# Patient Record
Sex: Male | Born: 1970 | Race: Black or African American | Hispanic: No | Marital: Single | Smoking: Current every day smoker
Health system: Southern US, Community
[De-identification: ages and names within clinical notes are randomized; demographics above are authoritative.]

## PROBLEM LIST (undated history)

## (undated) DIAGNOSIS — R7303 Prediabetes: Secondary | ICD-10-CM

## (undated) DIAGNOSIS — I10 Essential (primary) hypertension: Secondary | ICD-10-CM

---

## 2011-01-11 ENCOUNTER — Emergency Department: Payer: Self-pay | Admitting: Emergency Medicine

## 2011-05-31 ENCOUNTER — Emergency Department: Payer: Self-pay | Admitting: Unknown Physician Specialty

## 2011-07-08 ENCOUNTER — Emergency Department: Payer: Self-pay | Admitting: *Deleted

## 2012-02-13 ENCOUNTER — Emergency Department: Payer: Self-pay | Admitting: Emergency Medicine

## 2012-02-13 LAB — COMPREHENSIVE METABOLIC PANEL
Albumin: 3.7 g/dL (ref 3.4–5.0)
Alkaline Phosphatase: 80 U/L (ref 50–136)
Anion Gap: 8 (ref 7–16)
BUN: 8 mg/dL (ref 7–18)
Bilirubin,Total: 0.6 mg/dL (ref 0.2–1.0)
Calcium, Total: 9 mg/dL (ref 8.5–10.1)
Chloride: 109 mmol/L — ABNORMAL HIGH (ref 98–107)
Co2: 24 mmol/L (ref 21–32)
Creatinine: 2.04 mg/dL — ABNORMAL HIGH (ref 0.60–1.30)
EGFR (African American): 46 — ABNORMAL LOW
EGFR (Non-African Amer.): 40 — ABNORMAL LOW
Glucose: 105 mg/dL — ABNORMAL HIGH (ref 65–99)
Osmolality: 280 (ref 275–301)
Potassium: 3.9 mmol/L (ref 3.5–5.1)
SGOT(AST): 28 U/L (ref 15–37)
SGPT (ALT): 26 U/L (ref 12–78)
Sodium: 141 mmol/L (ref 136–145)
Total Protein: 7.2 g/dL (ref 6.4–8.2)

## 2012-02-13 LAB — CBC
HCT: 40.9 % (ref 40.0–52.0)
HGB: 13.8 g/dL (ref 13.0–18.0)
MCH: 30.3 pg (ref 26.0–34.0)
MCHC: 33.9 g/dL (ref 32.0–36.0)
MCV: 90 fL (ref 80–100)
Platelet: 232 10*3/uL (ref 150–440)
RBC: 4.57 10*6/uL (ref 4.40–5.90)
RDW: 13.2 % (ref 11.5–14.5)
WBC: 10.3 10*3/uL (ref 3.8–10.6)

## 2012-02-13 LAB — URINALYSIS, COMPLETE
Bacteria: NONE SEEN
Bilirubin,UR: NEGATIVE
Glucose,UR: NEGATIVE mg/dL (ref 0–75)
Ketone: NEGATIVE
Leukocyte Esterase: NEGATIVE
Nitrite: NEGATIVE
Ph: 7 (ref 4.5–8.0)
Protein: 30
RBC,UR: 3 /HPF (ref 0–5)
Specific Gravity: 1.003 (ref 1.003–1.030)
Squamous Epithelial: NONE SEEN
WBC UR: 1 /HPF (ref 0–5)

## 2012-02-13 LAB — LIPASE, BLOOD: Lipase: 77 U/L (ref 73–393)

## 2013-03-27 IMAGING — CT CT ABD-PELV W/O CM
1 of 2 series · 15 of 32 positions shown, 19 images · non-contrast
Comparison: none

REASON FOR EXAM: (1) RLQ pain; (2) RLQ pain;    NOTE: Nursing to Give
Oral CT Contrast
COMMENTS:

[Series 2: 3mm soft tissue · axial · 0.78mm/px · z∈[-932,-490]mm · 15 of 162 slices shown, 19 images]
[im 8/162  soft-tissue]
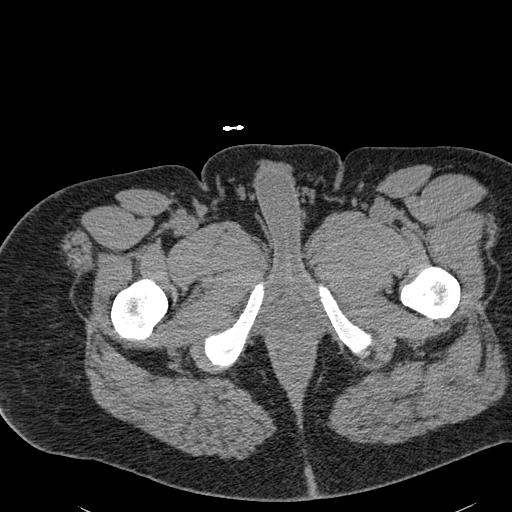
[im 8/162  bone]
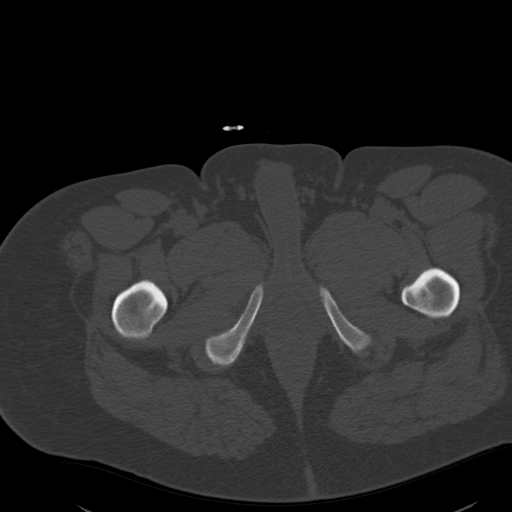
[im 22/162  soft-tissue]
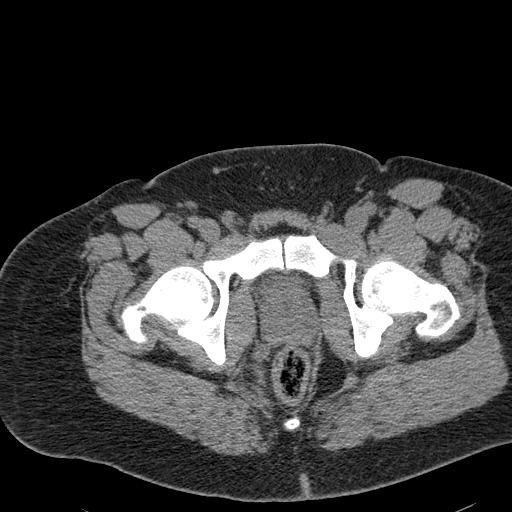
[im 36/162  soft-tissue]
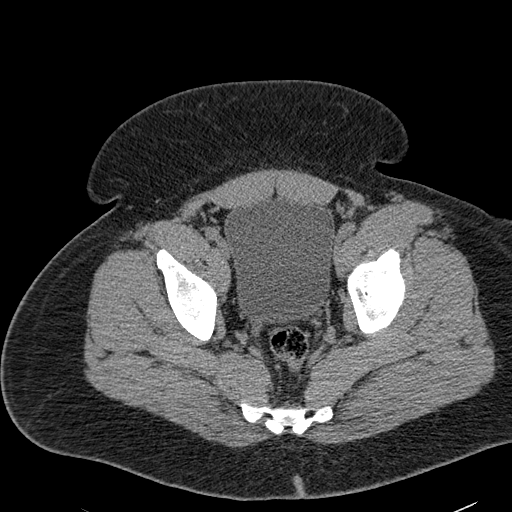
[im 43/162  soft-tissue]
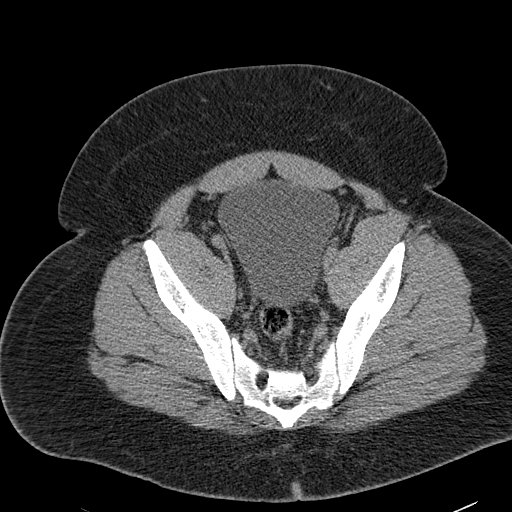
[im 57/162  soft-tissue]
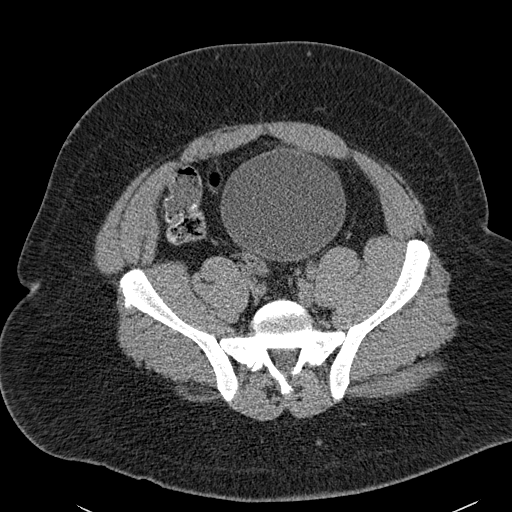
[im 71/162  soft-tissue]
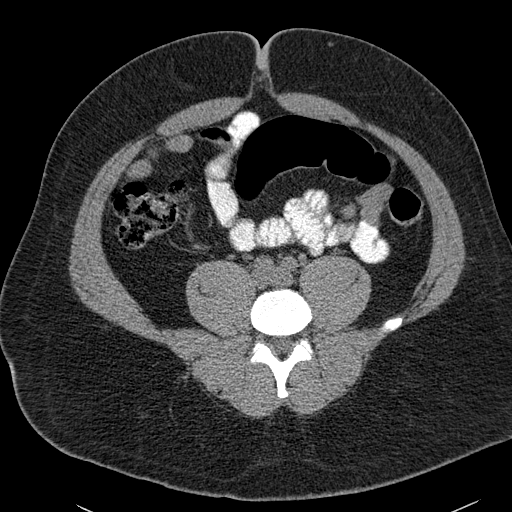
[im 85/162  soft-tissue]
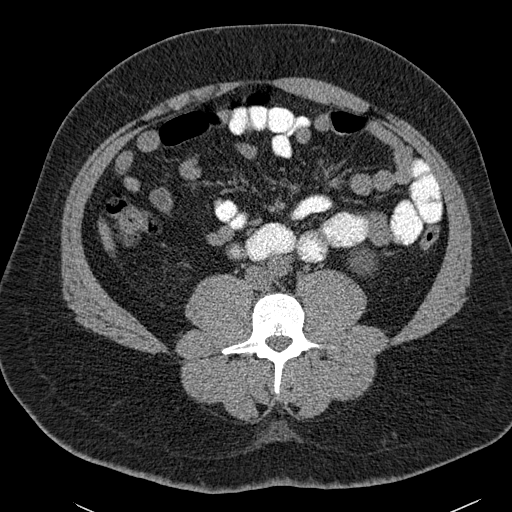
[im 92/162  soft-tissue]
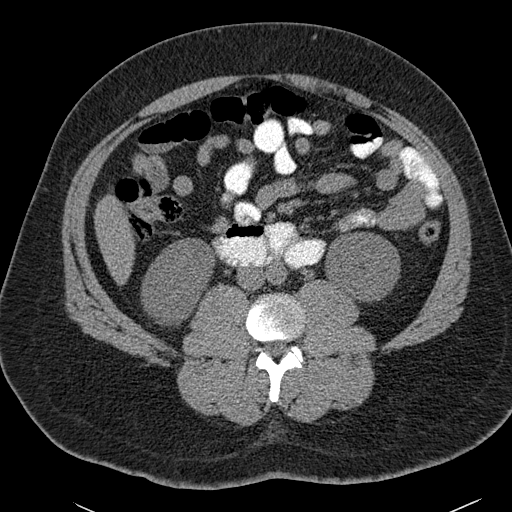
[im 106/162  soft-tissue]
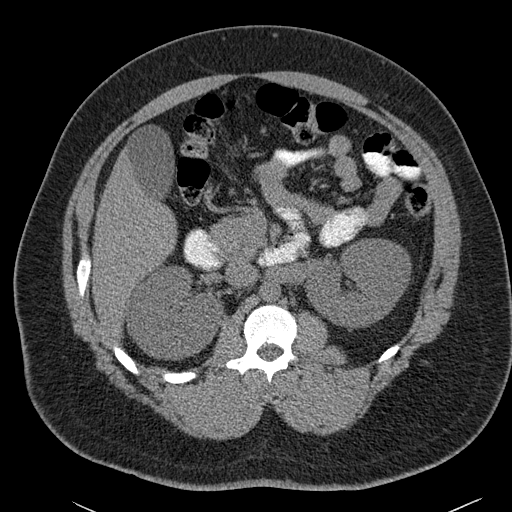
[im 106/162  bone]
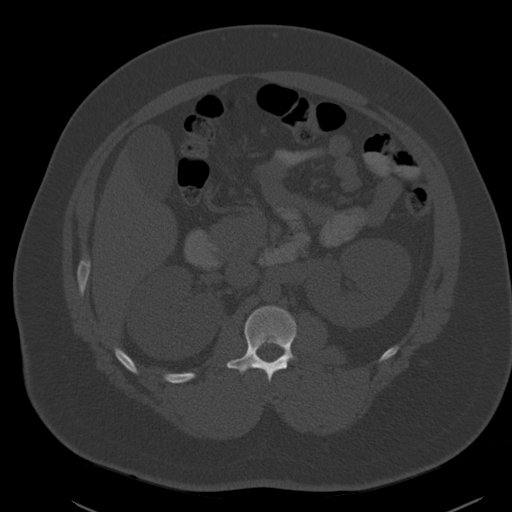
[im 120/162  soft-tissue]
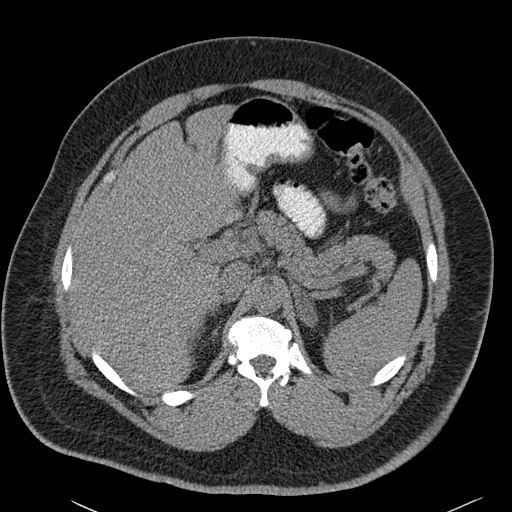
[im 127/162  soft-tissue]
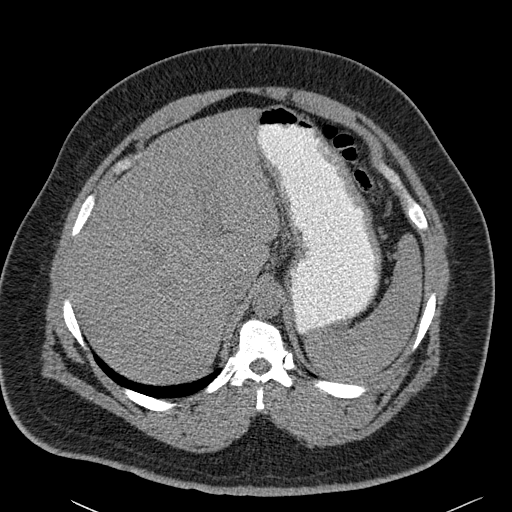
[im 134/162  lung]
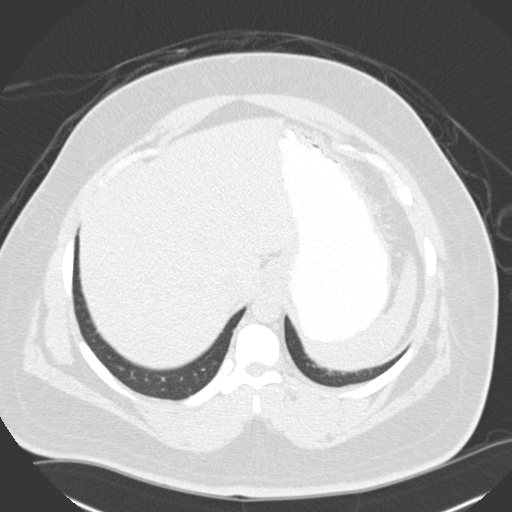
[im 141/162  soft-tissue]
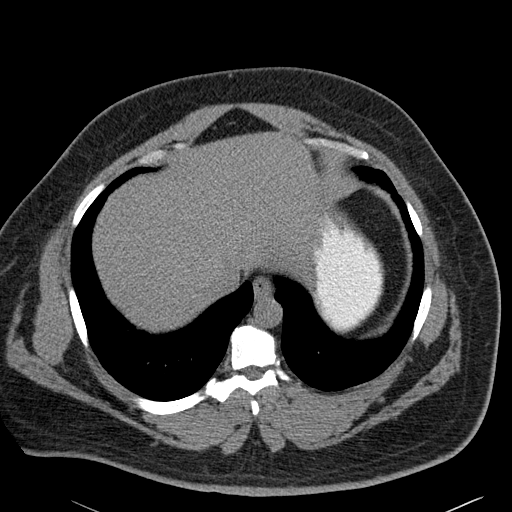
[im 141/162  lung]
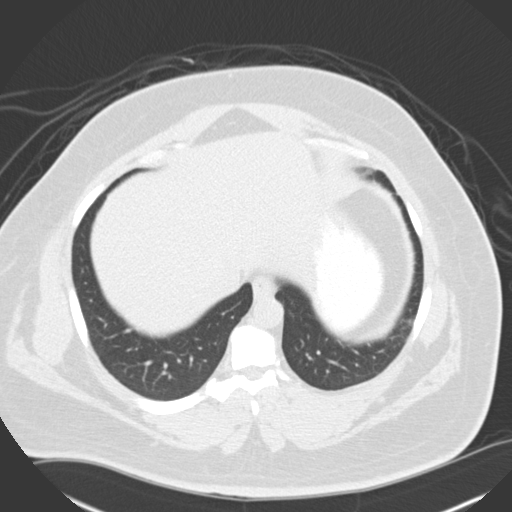
[im 148/162  lung]
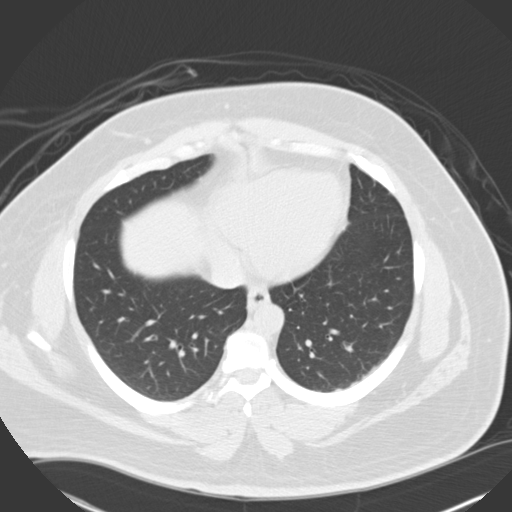
[im 155/162  soft-tissue]
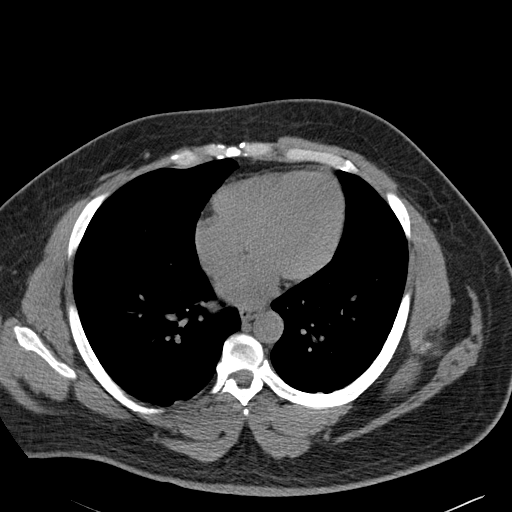
[im 155/162  lung]
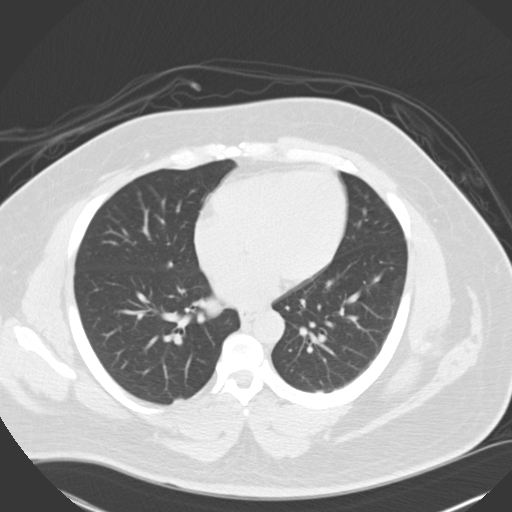

[15 of 32 positions shown; findings below may reference images not displayed]

PROCEDURE:     CT  - CT ABDOMEN AND PELVIS W[DATE]  [DATE]

RESULT:     CT of the abdomen and pelvis is performed with oral contrast
only and reconstructed at 3.0 mm slice thickness in the axial plane. There
is no previous similar study for comparison.

Images through the lung bases show dependent atelectasis bilaterally. The
heart is not enlarged. There is no pleural or pericardial effusion.

There is no evidence of abnormal bowel distention. The appendix appears
normal in caliber. There is a moderate amount of urine in the urinary
bladder. No radiopaque gallstones are evident. No urinary obstruction or
nephrolithiasis is evident. The adrenal glands appear normal. The spleen,
liver and pancreas appear unremarkable for noncontrast exam. The abdominal
aorta is normal in caliber without evidence of dissection. There is no
ascites, acute inflammatory change or abscess evident. The abdominal wall
appears intact. The bony structures appear unremarkable.
IMPRESSION: 1. No acute abnormality of the abdomen and pelvis. Normal-appearing
appendix. The oral contrast does not reach the cecum to a significant degree
at the time of imaging.

[REDACTED]

## 2016-06-22 ENCOUNTER — Encounter: Payer: Self-pay | Admitting: Emergency Medicine

## 2016-06-22 ENCOUNTER — Emergency Department
Admission: EM | Admit: 2016-06-22 | Discharge: 2016-06-22 | Disposition: A | Discharge status: Home / Self Care | Payer: Self-pay | Attending: Emergency Medicine | Admitting: Emergency Medicine

## 2016-06-22 DIAGNOSIS — F17200 Nicotine dependence, unspecified, uncomplicated: Secondary | ICD-10-CM | POA: Insufficient documentation

## 2016-06-22 DIAGNOSIS — Y929 Unspecified place or not applicable: Secondary | ICD-10-CM | POA: Insufficient documentation

## 2016-06-22 DIAGNOSIS — W57XXXA Bitten or stung by nonvenomous insect and other nonvenomous arthropods, initial encounter: Secondary | ICD-10-CM | POA: Insufficient documentation

## 2016-06-22 DIAGNOSIS — Y939 Activity, unspecified: Secondary | ICD-10-CM | POA: Insufficient documentation

## 2016-06-22 DIAGNOSIS — S00462A Insect bite (nonvenomous) of left ear, initial encounter: Secondary | ICD-10-CM | POA: Insufficient documentation

## 2016-06-22 DIAGNOSIS — Y999 Unspecified external cause status: Secondary | ICD-10-CM | POA: Insufficient documentation

## 2016-06-22 MED ORDER — CETIRIZINE HCL 10 MG PO TABS: 10.0000 mg | ORAL_TABLET | Freq: Every day | ORAL | 0 refills | Status: DC

## 2016-06-22 NOTE — ED Triage Notes (Signed)
Pt presents with possible spider bite to left ear today, reports a burning sensation in his left ear.

## 2016-06-22 NOTE — ED Provider Notes (Signed)
[facility] Emergency Department Provider Note  ____________________________________________  Time seen: Approximately 4:59 PM  I have reviewed the triage vital signs and the nursing notes.   HISTORY  Chief Complaint Insect Bite    HPI Brent Matthews is a 45 y.o. male who presents emergency department complaining of left ear pain after being "bit by something." Patient believes that he was bit by a spider to the left ear but was unable to visualize insect causing pain. Patient reports that area feels puffy but denies any drainage, vesiculation, or other visible bite mark. Patient reports that he immediately cleansed the area using rubbing alcohol and hydrogen peroxide. Patient reports that area is mildly tender but does not cause significant pain. He denies any rash or facial swelling. No other complaint at this time. No medications prior to arrival.   History reviewed. No pertinent past medical history.  There are no active problems to display for this patient.   History reviewed. No pertinent surgical history.  Prior to Admission medications   Medication Sig Start Date End Date Taking? Authorizing Provider  cetirizine (ZYRTEC) 10 MG tablet Take 1 tablet (10 mg total) by mouth daily. 06/22/16   Delorise RoyalsJonathan D Kiernan Atkerson, PA-C    Allergies Sulfa antibiotics  No family history on file.  Social History Social History  Substance Use Topics  . Smoking status: Current Some Day Smoker  . Smokeless tobacco: Never Used  . Alcohol use Yes     Review of Systems  Constitutional: No fever/chills Eyes: No visual changes. No discharge ENT: Positive for bug bite to the left ear Cardiovascular: no chest pain. Respiratory: no cough. No SOB. Gastrointestinal: No abdominal pain.  No nausea, no vomiting.  Musculoskeletal: Negative for musculoskeletal pain. Skin: Negative for rash, abrasions, lacerations, ecchymosis. Neurological: Negative for headaches, focal  weakness or numbness. 10-point ROS otherwise negative.  ____________________________________________   PHYSICAL EXAM:  VITAL SIGNS: ED Triage Vitals  Enc Vitals Group     BP 06/22/16 1648 (!) 149/93     Pulse Rate 06/22/16 1648 100     Resp 06/22/16 1648 20     Temp 06/22/16 1648 98.2 F (36.8 C)     Temp Source 06/22/16 1648 Oral     SpO2 06/22/16 1648 98 %     Weight 06/22/16 1649 235 lb (106.6 kg)     Height 06/22/16 1649 5\' 8"  (1.727 m)     Head Circumference --      Peak Flow --      Pain Score 06/22/16 1649 6     Pain Loc --      Pain Edu? --      Excl. in GC? --      Constitutional: Alert and oriented. Well appearing and in no acute distress. Eyes: Conjunctivae are normal. PERRL. EOMI. Head: Atraumatic. ENT:      Ears: Left lower auricle mildly edematous. Area is not erythematous. No tenderness to palpation. No drainage. No fasciculations. No periauricular swelling.      Nose: No congestion/rhinnorhea.      Mouth/Throat: Mucous membranes are moist.  Neck: No stridor.   Hematological/Lymphatic/Immunilogical: No cervical lymphadenopathy. Cardiovascular: Normal rate, regular rhythm. Normal S1 and S2.  Good peripheral circulation. Respiratory: Normal respiratory effort without tachypnea or retractions. Lungs CTAB. Good air entry to the bases with no decreased or absent breath sounds. Musculoskeletal: Full range of motion to all extremities. No gross deformities appreciated. Neurologic:  Normal speech and language. No gross focal neurologic deficits are  appreciated.  Skin:  Skin is warm, dry and intact. No rash noted. Psychiatric: Mood and affect are normal. Speech and behavior are normal. Patient exhibits appropriate insight and judgement.   ____________________________________________   LABS (all labs ordered are listed, but only abnormal results are displayed)  Labs Reviewed - No data to  display ____________________________________________  EKG   ____________________________________________  RADIOLOGY  No results found.  ____________________________________________    PROCEDURES  Procedure(s) performed:    Procedures    Medications - No data to display   ____________________________________________   INITIAL IMPRESSION / ASSESSMENT AND PLAN / ED COURSE  Pertinent labs & imaging results that were available during my care of the patient were reviewed by me and considered in my medical decision making (see chart for details).  Review of the [place] CSRS was performed in accordance of the [facility] prior to dispensing any controlled drugs.  Clinical Course     Patient's diagnosis is consistent with Localized skin reaction to bug bite to the left ear. No signs of infection. Exam is reassuring. No indication for labs or imaging at this time.. Patient will be discharged home with prescriptions for antihistamine to be taken for symptom control. Patient is to follow up with primary care as needed or otherwise directed. Patient is given ED precautions to return to the ED for any worsening or new symptoms.     ____________________________________________  FINAL CLINICAL IMPRESSION(S) / ED DIAGNOSES  Final diagnoses:  Bug bite, initial encounter      NEW MEDICATIONS STARTED DURING THIS VISIT:  New Prescriptions   CETIRIZINE (ZYRTEC) 10 MG TABLET    Take 1 tablet (10 mg total) by mouth daily.        This chart was dictated using voice recognition software/Dragon. Despite best efforts to proofread, errors can occur which can change the meaning. Any change was purely unintentional.    Racheal PatchesJonathan D Cymone Yeske, PA-C 06/22/16 1809    Sharman CheekPhillip Stafford, MD 06/23/16 2244

## 2016-12-04 ENCOUNTER — Emergency Department: Payer: Self-pay

## 2016-12-04 ENCOUNTER — Emergency Department
Admission: EM | Admit: 2016-12-04 | Discharge: 2016-12-05 | Disposition: A | Discharge status: Home / Self Care | Payer: Self-pay | Attending: Emergency Medicine | Admitting: Emergency Medicine

## 2016-12-04 ENCOUNTER — Encounter: Payer: Self-pay | Admitting: Emergency Medicine

## 2016-12-04 DIAGNOSIS — F1910 Other psychoactive substance abuse, uncomplicated: Secondary | ICD-10-CM | POA: Insufficient documentation

## 2016-12-04 DIAGNOSIS — F17290 Nicotine dependence, other tobacco product, uncomplicated: Secondary | ICD-10-CM | POA: Insufficient documentation

## 2016-12-04 DIAGNOSIS — R000 Tachycardia, unspecified: Secondary | ICD-10-CM | POA: Insufficient documentation

## 2016-12-04 DIAGNOSIS — F1410 Cocaine abuse, uncomplicated: Secondary | ICD-10-CM | POA: Insufficient documentation

## 2016-12-04 DIAGNOSIS — I10 Essential (primary) hypertension: Secondary | ICD-10-CM | POA: Insufficient documentation

## 2016-12-04 DIAGNOSIS — R4182 Altered mental status, unspecified: Secondary | ICD-10-CM | POA: Insufficient documentation

## 2016-12-04 HISTORY — DX: Essential (primary) hypertension: I10

## 2016-12-04 HISTORY — DX: Prediabetes: R73.03

## 2016-12-04 LAB — URINE DRUG SCREEN, QUALITATIVE (ARMC ONLY)
Amphetamines, Ur Screen: NOT DETECTED
Barbiturates, Ur Screen: NOT DETECTED
Benzodiazepine, Ur Scrn: NOT DETECTED
Cannabinoid 50 Ng, Ur ~~LOC~~: NOT DETECTED
Cocaine Metabolite,Ur ~~LOC~~: POSITIVE — AB
MDMA (Ecstasy)Ur Screen: NOT DETECTED
Methadone Scn, Ur: NOT DETECTED
Opiate, Ur Screen: NOT DETECTED
Phencyclidine (PCP) Ur S: NOT DETECTED
Tricyclic, Ur Screen: NOT DETECTED

## 2016-12-04 LAB — BASIC METABOLIC PANEL
Anion gap: 10 (ref 5–15)
BUN: 11 mg/dL (ref 6–20)
CO2: 22 mmol/L (ref 22–32)
Calcium: 9.7 mg/dL (ref 8.9–10.3)
Chloride: 105 mmol/L (ref 101–111)
Creatinine, Ser: 1.28 mg/dL — ABNORMAL HIGH (ref 0.61–1.24)
GFR calc Af Amer: >60 mL/min (ref >60)
GFR calc non Af Amer: >60 mL/min (ref >60)
Glucose, Bld: 116 mg/dL — ABNORMAL HIGH (ref 65–99)
Potassium: 3.5 mmol/L (ref 3.5–5.1)
Sodium: 137 mmol/L (ref 135–145)

## 2016-12-04 LAB — TROPONIN I: Troponin I: <0.03 ng/mL (ref <0.03)

## 2016-12-04 LAB — CBC
HCT: 49.4 % (ref 40.0–52.0)
Hemoglobin: 16.5 g/dL (ref 13.0–18.0)
MCH: 30.1 pg (ref 26.0–34.0)
MCHC: 33.4 g/dL (ref 32.0–36.0)
MCV: 90.2 fL (ref 80.0–100.0)
Platelets: 223 10*3/uL (ref 150–440)
RBC: 5.47 MIL/uL (ref 4.40–5.90)
RDW: 13.9 % (ref 11.5–14.5)
WBC: 10 10*3/uL (ref 3.8–10.6)

## 2016-12-04 MED ORDER — ZIPRASIDONE MESYLATE 20 MG IM SOLR
INTRAMUSCULAR | Status: AC
  Filled 2016-12-04: qty 20

## 2016-12-04 MED ORDER — ZIPRASIDONE MESYLATE 20 MG IM SOLR
20.0000 mg | Freq: Once | INTRAMUSCULAR | Status: AC
  Administered 2016-12-04: 20 mg via INTRAMUSCULAR

## 2016-12-04 MED ORDER — LORAZEPAM 2 MG/ML IJ SOLN
1.0000 mg | Freq: Once | INTRAMUSCULAR | Status: DC
  Filled 2016-12-04: qty 1

## 2016-12-04 NOTE — ED Notes (Signed)
PT  IVC 

## 2016-12-04 NOTE — ED Notes (Signed)
Pt sleeping att; snoring resp; will continue to monitor

## 2016-12-04 NOTE — ED Triage Notes (Signed)
Patient presents to ED via [facility] due to "not feeling well". EMS reports HR in 140's. Patient states "It felt like my throat was closing up". Patient drank 1 beer today. EMS reported concerns in regards to crack cocaine use. Patient denies any drug consumption. Eyes are glazed, nystagmus noted. A&O x4.

## 2016-12-04 NOTE — ED Provider Notes (Signed)
[facility] Emergency Department Provider Note   ____________________________________________    I have reviewed the triage vital signs and the nursing notes.   HISTORY  Chief Complaint Tachycardia     HPI Garet Hooton is a 46 y.o. male who presents via EMS for unclear reasons. EMS reports that they were called because he was "not feeling well". They noted his heart rate was quite elevated and convinced him to come to the emergency department. Upon arrival patient appears somewhat altered and EMS reports there may have been drug paraphernalia at his house. Patient tells me he feels "all right and I don't need to be here ". Patient denies drug abuse   Past Medical History:  Diagnosis Date  . Hypertension   . Pre-diabetes     There are no active problems to display for this patient.   No past surgical history on file.  Prior to Admission medications   Medication Sig Start Date End Date Taking? Authorizing Provider  cetirizine (ZYRTEC) 10 MG tablet Take 1 tablet (10 mg total) by mouth daily. 06/22/16   Cuthriell, Delorise Royals, PA-C     Allergies Sulfa antibiotics  No family history on file.  Social History Social History  Substance Use Topics  . Smoking status: Current Every Day Smoker    Types: Cigars  . Smokeless tobacco: Never Used  . Alcohol use Yes     Comment: One 40 oz 3x weekly    Review of Systems  Constitutional: No fever/chills Eyes: No visual changes.  ENT: No sore throat. Cardiovascular: Denies chest pain.He denies palpitations Respiratory: Denies shortness of breath. Gastrointestinal: No abdominal pain.  No nausea, no vomiting.   Genitourinary: Negative for dysuria. Musculoskeletal: Negative for back pain. Skin: Negative for rash. Neurological: Negative for headaches or weakness   ____________________________________________   PHYSICAL EXAM:  VITAL SIGNS: ED Triage Vitals  Enc Vitals Group     BP --    Pulse Rate 12/04/16 1743 (!) 142     Resp 12/04/16 1743 20     Temp 12/04/16 1743 98.2 F (36.8 C)     Temp Source 12/04/16 1743 Oral     SpO2 12/04/16 1743 98 %     Weight 12/04/16 1738 106.6 kg (235 lb)     Height 12/04/16 1738 1.727 m (5\' 8" )     Head Circumference --      Peak Flow --      Pain Score --      Pain Loc --      Pain Edu? --      Excl. in GC? --     Constitutional: Alert and oriented. No acute distress.  Eyes: Conjunctivae are normal.  Head: Atraumatic.  Mouth/Throat: Mucous membranes are moist.    Cardiovascular: Significant tachycardia, regular rhythm. Grossly normal heart sounds.  Good peripheral circulation. Respiratory: Normal respiratory effort.  No retractions. Lungs CTAB. Gastrointestinal: Soft and nontender. No distention.  No CVA tenderness. Genitourinary: deferred Musculoskeletal: No lower extremity tenderness nor edema.  Warm and well perfused Neurologic:  No gross focal neurologic deficits are appreciated.  Skin:  Skin is warm, dry and intact. No rash noted.   ____________________________________________   LABS (all labs ordered are listed, but only abnormal results are displayed)  Labs Reviewed  BASIC METABOLIC PANEL - Abnormal; Notable for the following:       Result Value   Glucose, Bld 116 (*)    Creatinine, Ser 1.28 (*)    All other  components within normal limits  URINE DRUG SCREEN, QUALITATIVE ([facility] ONLY) - Abnormal; Notable for the following:    Cocaine Metabolite,Ur [place] POSITIVE (*)    All other components within normal limits  CBC  TROPONIN I   ____________________________________________  EKG  ED ECG REPORT I, Mackensie Pilson,Jene Every the attending physician, personally viewed and interpreted this ECG.  Date: 12/04/2016  Rate: 138 Rhythm: Sinus tachycardia QRS Axis: normal Intervals: normal ST/T Wave abnormalities: normal Conduction Disturbances: none   ____________________________________________  RADIOLOGY  Patient  refuses x-ray ____________________________________________   PROCEDURES  Procedure(s) performed: No    Critical Care performed: No ____________________________________________   INITIAL IMPRESSION / ASSESSMENT AND PLAN / ED COURSE  Pertinent labs & imaging results that were available during my care of the patient were reviewed by me and considered in my medical decision making (see chart for details).  Patient presents with significant tachycardia and mild altered mental status, for example after the nurse wiped alcohol on his arm to put in an IV he sniffed his arm repeatedly. Drug abuse is certainly a possibility. IV Ativan ordered given his tachycardia I suspect possible crack/cocaine  Prior to getting IV Ativan Patient walked out of his room and attempted to leave via the ambulance bay doors with an IV in his arm. He appeared slightly more confused and now also aggressive. He was coaxed back to his room by security at which point he became quite aggressive shouting insults at the staff and threatening us. Geodon 20 mg IM ordered for staff and patient protection   Unfortunately while administering Geodon nurse suffered accidental needlestick.  Physical restraints were brought out for the patient but ultimately were not used  ----------------------------------------- 7:52 PM on 12/04/2016 -----------------------------------------  Patient asleep, labwork unremarkable. UDS positive for cocaine. Pending psych consult    ____________________________________________   FINAL CLINICAL IMPRESSION(S) / ED DIAGNOSES  Final diagnoses:  Substance abuse      NEW MEDICATIONS STARTED DURING THIS VISIT:  New Prescriptions   No medications on file     Note:  This document was prepared using Dragon voice recognition software and may include unintentional dictation errors.    Jene EveryKinner, Aashritha Miedema, MD 12/04/16 2220

## 2016-12-04 NOTE — ED Notes (Signed)
Patient ripped off cardiac monitor. Patient attempting to walk out of EMS bay. Dr. Cyril LoosenKinner notified. IVC papers being taking out.

## 2016-12-04 NOTE — ED Notes (Signed)
Patient was never placed in 4 point restraint.

## 2016-12-04 NOTE — ED Notes (Signed)
Verbal order from Dr. Cyril LoosenKinner for 4 point restraint.

## 2016-12-05 NOTE — ED Provider Notes (Signed)
Patient evaluated by Dr. Hermelinda MedicusAlvarado  who stated the patient does not meet criteria for involuntary commitment and a such reversed the commitment. Patient positive for cocaine which is most likely the etiology of the patient's tachycardia and altered mental status. Dr. Hermelinda MedicusAlvarado recommended discharge with outpatient follow-up.   Darci CurrentBrown, Eleanor N, MD 12/05/16 [phone]

## 2016-12-05 NOTE — ED Notes (Signed)
SOC with pt att; given blanket and ginger ale

## 2019-10-10 ENCOUNTER — Encounter: Payer: Self-pay | Admitting: Emergency Medicine

## 2019-10-10 ENCOUNTER — Emergency Department
Admission: EM | Admit: 2019-10-10 | Discharge: 2019-10-11 | Disposition: A | Discharge status: Home / Self Care | Payer: Self-pay | Attending: Emergency Medicine | Admitting: Emergency Medicine

## 2019-10-10 ENCOUNTER — Other Ambulatory Visit: Payer: Self-pay

## 2019-10-10 DIAGNOSIS — F23 Brief psychotic disorder: Secondary | ICD-10-CM | POA: Insufficient documentation

## 2019-10-10 DIAGNOSIS — Z20822 Contact with and (suspected) exposure to covid-19: Secondary | ICD-10-CM | POA: Insufficient documentation

## 2019-10-10 DIAGNOSIS — F1310 Sedative, hypnotic or anxiolytic abuse, uncomplicated: Secondary | ICD-10-CM | POA: Insufficient documentation

## 2019-10-10 DIAGNOSIS — F17290 Nicotine dependence, other tobacco product, uncomplicated: Secondary | ICD-10-CM | POA: Insufficient documentation

## 2019-10-10 DIAGNOSIS — F1210 Cannabis abuse, uncomplicated: Secondary | ICD-10-CM | POA: Insufficient documentation

## 2019-10-10 DIAGNOSIS — F1910 Other psychoactive substance abuse, uncomplicated: Secondary | ICD-10-CM | POA: Insufficient documentation

## 2019-10-10 DIAGNOSIS — F22 Delusional disorders: Secondary | ICD-10-CM | POA: Insufficient documentation

## 2019-10-10 DIAGNOSIS — F1410 Cocaine abuse, uncomplicated: Secondary | ICD-10-CM | POA: Insufficient documentation

## 2019-10-10 DIAGNOSIS — I10 Essential (primary) hypertension: Secondary | ICD-10-CM | POA: Insufficient documentation

## 2019-10-10 LAB — CBC WITH DIFFERENTIAL/PLATELET
Abs Immature Granulocytes: 0.05 10*3/uL (ref 0.00–0.07)
Basophils Absolute: 0 10*3/uL (ref 0.0–0.1)
Basophils Relative: 0 %
Eosinophils Absolute: 0 10*3/uL (ref 0.0–0.5)
Eosinophils Relative: 0 %
HCT: 44.6 % (ref 39.0–52.0)
Hemoglobin: 14.4 g/dL (ref 13.0–17.0)
Immature Granulocytes: 1 %
Lymphocytes Relative: 14 %
Lymphs Abs: 1.4 10*3/uL (ref 0.7–4.0)
MCH: 30.2 pg (ref 26.0–34.0)
MCHC: 32.3 g/dL (ref 30.0–36.0)
MCV: 93.5 fL (ref 80.0–100.0)
Monocytes Absolute: 0.4 10*3/uL (ref 0.1–1.0)
Monocytes Relative: 5 %
Neutro Abs: 7.6 10*3/uL (ref 1.7–7.7)
Neutrophils Relative %: 80 %
Platelets: 246 10*3/uL (ref 150–400)
RBC: 4.77 MIL/uL (ref 4.22–5.81)
RDW: 13.9 % (ref 11.5–15.5)
WBC: 9.5 10*3/uL (ref 4.0–10.5)
nRBC: 0 % (ref 0.0–0.2)

## 2019-10-10 LAB — COMPREHENSIVE METABOLIC PANEL
ALT: 21 U/L (ref 0–44)
AST: 29 U/L (ref 15–41)
Albumin: 4 g/dL (ref 3.5–5.0)
Alkaline Phosphatase: 57 U/L (ref 38–126)
Anion gap: 8 (ref 5–15)
BUN: 14 mg/dL (ref 6–20)
CO2: 24 mmol/L (ref 22–32)
Calcium: 9.4 mg/dL (ref 8.9–10.3)
Chloride: 106 mmol/L (ref 98–111)
Creatinine, Ser: 1.26 mg/dL — ABNORMAL HIGH (ref 0.61–1.24)
GFR calc Af Amer: >60 mL/min (ref >60)
GFR calc non Af Amer: >60 mL/min (ref >60)
Glucose, Bld: 93 mg/dL (ref 70–99)
Potassium: 3.7 mmol/L (ref 3.5–5.1)
Sodium: 138 mmol/L (ref 135–145)
Total Bilirubin: 0.7 mg/dL (ref 0.3–1.2)
Total Protein: 6.8 g/dL (ref 6.5–8.1)

## 2019-10-10 LAB — CK: Total CK: 426 U/L — ABNORMAL HIGH (ref 49–397)

## 2019-10-10 LAB — TROPONIN I (HIGH SENSITIVITY): Troponin I (High Sensitivity): 35 ng/L — ABNORMAL HIGH (ref <18)

## 2019-10-10 LAB — SALICYLATE LEVEL: Salicylate Lvl: <7 mg/dL — ABNORMAL LOW (ref 7.0–30.0)

## 2019-10-10 LAB — MAGNESIUM: Magnesium: 2.1 mg/dL (ref 1.7–2.4)

## 2019-10-10 LAB — ACETAMINOPHEN LEVEL: Acetaminophen (Tylenol), Serum: <10 ug/mL — ABNORMAL LOW (ref 10–30)

## 2019-10-10 LAB — ETHANOL: Alcohol, Ethyl (B): <10 mg/dL (ref <10)

## 2019-10-10 MED ORDER — HALOPERIDOL LACTATE 5 MG/ML IJ SOLN
5.0000 mg | Freq: Once | INTRAMUSCULAR | Status: AC
  Administered 2019-10-10: 5 mg via INTRAMUSCULAR
  Filled 2019-10-10: qty 1

## 2019-10-10 MED ORDER — LORAZEPAM 2 MG/ML IJ SOLN
2.0000 mg | Freq: Once | INTRAMUSCULAR | Status: AC
  Administered 2019-10-10: 2 mg via INTRAMUSCULAR
  Filled 2019-10-10: qty 1

## 2019-10-10 MED ORDER — SODIUM CHLORIDE 0.9 % IV BOLUS
1000.0000 mL | Freq: Once | INTRAVENOUS | Status: AC
  Administered 2019-10-10: 1000 mL via INTRAVENOUS

## 2019-10-10 NOTE — ED Provider Notes (Signed)
[facility] Emergency Department Provider Note   ____________________________________________   First MD Initiated Contact with Patient 10/10/19 2048     (approximate)  I have reviewed the triage vital signs and the nursing notes.   HISTORY  Chief Complaint Altered Mental Status  EM caveat: Patient altered mental status, very paranoid hyper agitated  HPI Brent Matthews is a 49 y.o. male   brought by EMS after he had been apparently driving about different places in [place] calling 911 reporting he had been poisoned and acting erratically    Past Medical History:  Diagnosis Date  . Hypertension   . Pre-diabetes     There are no problems to display for this patient.   History reviewed. No pertinent surgical history.  Prior to Admission medications   Medication Sig Start Date End Date Taking? Authorizing Provider  lisinopril-hydrochlorothiazide (PRINZIDE,ZESTORETIC) 20-25 MG tablet Take 1 tablet by mouth daily. 11/03/16   [provider]    Allergies Sulfa antibiotics  History reviewed. No pertinent family history.  Social History Social History   Tobacco Use  . Smoking status: Current Every Day Smoker    Types: Cigars  . Smokeless tobacco: Never Used  Substance Use Topics  . Alcohol use: Yes    Comment: One 40 oz 3x weekly  . Drug use: No    Review of Systems  EM caveat    ____________________________________________   PHYSICAL EXAM:  VITAL SIGNS: ED Triage Vitals  Enc Vitals Group     BP 10/10/19 2046 (!) 151/91     Pulse Rate 10/10/19 2046 (!) 122     Resp 10/10/19 2046 20     Temp --      Temp src --      SpO2 10/10/19 2045 95 %     Weight 10/10/19 2047 250 lb (113.4 kg)     Height 10/10/19 2047 5\' 8"  (1.727 m)     Head Circumference --      Peak Flow --      Pain Score 10/10/19 2046 7     Pain Loc --      Pain Edu? --      Excl. in GC? --     Constitutional: Alert and oriented to self but not  well to situation, seems very paranoid, hyperalert very vigilant.  He is in no acute extremitas except appears very hyper alert and extremely paranoid Eyes: Conjunctivae are normal. Head: Atraumatic. Nose: No congestion/rhinnorhea. Mouth/Throat: Mucous membranes are moist. Neck: No stridor.  Cardiovascular: Tachycardic rate, regular rhythm.  Respiratory: Normal respiratory effort.  No retractions.  Mild tachypnea Gastrointestinal: No distention. Musculoskeletal: No lower extremity tenderness nor edema. Neurologic: Pressured speech and language.  Seems very paranoid about anything approaching him or getting close to him.  Moves all extremities well.  No seizure-like activity.  No involuntary movements witnessed. Skin:  Skin is warm, slightly diaphoretic and intact. No rash noted. Psychiatric: Mood and affect are elevated, paranoid.  Seems at risk of danger to himself, he does not specifically voice any suicidal thoughts or homicidal ideations but extremely paranoid and easily agitated hyper acutely aroused  ____________________________________________   LABS (all labs ordered are listed, but only abnormal results are displayed)  Labs Reviewed  COMPREHENSIVE METABOLIC PANEL - Abnormal; Notable for the following components:      Result Value   Creatinine, Ser 1.26 (*)    All other components within normal limits  CK - Abnormal; Notable for the following components:  Total CK 426 (*)    All other components within normal limits  TROPONIN I (HIGH SENSITIVITY) - Abnormal; Notable for the following components:   Troponin I (High Sensitivity) 35 (*)    All other components within normal limits  RESPIRATORY PANEL BY RT PCR (FLU A&B, COVID)  ETHANOL  CBC WITH DIFFERENTIAL/PLATELET  MAGNESIUM  URINE DRUG SCREEN, QUALITATIVE ([facility] ONLY)  SALICYLATE LEVEL  ACETAMINOPHEN LEVEL   ____________________________________________  EKG  Reviewed inter by me at 2015 Heart rate 120 QRS 70 QTc  600 Sinus tachycardia, diffuse ST abnormality, question if this is a may be repolarization abnormality.  Mild ST elevation in V2 with a convex type appearance and I feel is unlikely to represent acute STEMI.  Patient does not appear to be having active chest pain.  Will add troponin, follow clinically. ____________________________________________  RADIOLOGY  No results found.   ____________________________________________   PROCEDURES  Procedure(s) performed: None  Procedures  Critical Care performed: Yes, see critical care note(s)  CRITICAL CARE Performed by: Sharyn Creamer   Total critical care time: 25 minutes  Critical care time was exclusive of separately billable procedures and treating other patients.  Critical care was necessary to treat or prevent imminent or life-threatening deterioration.  Critical care was time spent personally by me on the following activities: development of treatment plan with patient and/or surrogate as well as nursing, discussions with consultants, evaluation of patient's response to treatment, examination of patient, obtaining history from patient or surrogate, ordering and performing treatments and interventions, ordering and review of laboratory studies, ordering and review of radiographic studies, pulse oximetry and re-evaluation of patient's condition.  ____________________________________________   INITIAL IMPRESSION / ASSESSMENT AND PLAN / ED COURSE  Pertinent labs & imaging results that were available during my care of the patient were reviewed by me and considered in my medical decision making (see chart for details).     Patient presents for presentation of extreme paranoia, appears hyper agitated.  Have requested IVC papers to be placed for him.  He appears likely under the influence of some type of sympathomimetic or stimulant medication based on his presentation today and also his previous history shows cocaine positive on  previous drug screen from a couple years ago where he presented with somewhat similar mannerisms.  I am suspicious that illicit substance may be responsible.   He does not appear to have focal abnormality, but appears elevated.  He is tachycardic, hypertensive.  For patient safety plan to give him some anxiolysis and also antipsychotic.  May have to give this as an IM injection in order to facilitate care and management.  ----------------------------------------- 9:39 PM on 10/10/2019 -----------------------------------------  Patient called in, resting now comfortably in the bed.  He seems to be much improving.  Heart rate is reduced about 110.  He is alert, calmer.  Appears in less distress and improved   Clinical Course as of Oct 10 2307  Tue Oct 10, 2019  2120 Discussed case and consult with Mikki Santee (psych NP)   [MQ]  2129 Patient remaining tachycardic, very paranoid.  Patient was excepting of intramuscular medications.  Continue to monitor carefully at this time.   [MQ]  2229 Repeat EKG reviewed at 2210Heart rate 110QRS 70QTc 460Sinus tachycardia, probable left ventricular hypertrophy with repolarization abnormality.  Slight slurring of his T wave in aVL.  Mild prolongation of QT suspected.  No significant changes from previous with regard to any concerns for ischemia.  Patient resting comfortably at this  time, allowing medical care, fluids and lab draw   [MQ]    Clinical Course User Index [MQ] Delman Kitten, MD    ----------------------------------------- 11:06 PM on 10/10/2019 -----------------------------------------  Ongoing care and disposition assigned to Dr. Owens Shark.  Follow-up on reassessments, second troponin.  Patient suspected to be under the influence of likely some type of sympathomimetic or stimulant, but ongoing evaluation for medical clearance and psychiatric consultation pending.  Patient under IVC.   ____________________________________________   FINAL CLINICAL  IMPRESSION(S) / ED DIAGNOSES  Final diagnoses:  Paranoia ([place])        Note:  This document was prepared using Dragon voice recognition software and may include unintentional dictation errors       Delman Kitten, MD 10/10/19 2309

## 2019-10-10 NOTE — ED Triage Notes (Signed)
Pt arrived to ED via [facility] and [facility], EMS reports pt has been running around [facility] all afternoon and calling 911, stating that he has been poisoned by someone with Cyanide and yelling "dont shoot me". Pt appears paranoid and is talking on his cell phone which is not powered on. Pt is being cooperative at this time with Vernona Rieger, NT, getting vitals. Pt smells of alcohol.

## 2019-10-10 NOTE — ED Notes (Signed)
Pt is currently resting on stretcher and talking on his phone, pt is calm at this time.

## 2019-10-10 NOTE — ED Notes (Signed)
This RN, and Selena Batten, RN at bedside attempting to administer IM injection, pt is refusing at this time and stating that he is does not need shots. 2 Security Officers at bedside to assist with administration.

## 2019-10-10 NOTE — ED Notes (Signed)
This RN, Selena Batten, RN and Beth, NT changed pt into red burgundy hospital scrubs. Pt items placed in bag are black hat, black pants, brown boots, black and green socks, orange underwear, tan belt, 2 cell phones. Pt has several credit cards, money, and keys in pants pockets. 3 pocket knives were placed with security.

## 2019-10-10 NOTE — ED Notes (Signed)
Pt is on stretcher, talking to his daughter. Selena Batten, RN is attempting to speak with pt daughter about pt condition and reasoning for being her but pt continues to interrupt. Pt is ripping off BP cuff, screaming "it is too tight".

## 2019-10-11 DIAGNOSIS — F1910 Other psychoactive substance abuse, uncomplicated: Secondary | ICD-10-CM | POA: Insufficient documentation

## 2019-10-11 LAB — URINE DRUG SCREEN, QUALITATIVE (ARMC ONLY)
Amphetamines, Ur Screen: NOT DETECTED
Barbiturates, Ur Screen: NOT DETECTED
Benzodiazepine, Ur Scrn: POSITIVE — AB
Cannabinoid 50 Ng, Ur ~~LOC~~: POSITIVE — AB
Cocaine Metabolite,Ur ~~LOC~~: POSITIVE — AB
MDMA (Ecstasy)Ur Screen: NOT DETECTED
Methadone Scn, Ur: NOT DETECTED
Opiate, Ur Screen: NOT DETECTED
Phencyclidine (PCP) Ur S: NOT DETECTED
Tricyclic, Ur Screen: POSITIVE — AB

## 2019-10-11 LAB — RESPIRATORY PANEL BY RT PCR (FLU A&B, COVID)
Influenza A by PCR: NEGATIVE
Influenza B by PCR: NEGATIVE
SARS Coronavirus 2 by RT PCR: NEGATIVE

## 2019-10-11 LAB — TROPONIN I (HIGH SENSITIVITY): Troponin I (High Sensitivity): 40 ng/L — ABNORMAL HIGH (ref <18)

## 2019-10-11 MED ORDER — SODIUM CHLORIDE 0.9 % IV BOLUS
1000.0000 mL | Freq: Once | INTRAVENOUS | Status: AC
  Administered 2019-10-11: 1000 mL via INTRAVENOUS

## 2019-10-11 NOTE — ED Notes (Signed)
Patient discharged home, patient received discharge papers. Patient received belongings and verbalized he has received all of his belongings. Patient appropriate and cooperative, Denies SI/HI AVH. Vital signs taken. NAD noted. 

## 2019-10-11 NOTE — BH Assessment (Addendum)
Assessment Note  Brent Matthews is an 49 y.o. male who presented to [facility] ED under IVC for treatment. EMS reports before pt arrival pt has been running around [facility] all afternoon and calling 911, stating that he has been poisoned by someone with Cyanide and yelling "don't shoot me". Pt appeared paranoid and was observed talking on his cell phone which was not powered on. Pt smelled of alcohol on arrival to ED. During initial TTS assessment Pt presented oriented x3. Pt confirmed believing he was poisoned yesterday due to strangers coming in/out the home but no longer believes he was poisoned today. Pt disclosed experiencing loss (cousin) last week and confirmed viewing the body this week. Pt identified the viewing to be the trigger of his psychosis and denied any current substance use. When asked to provide a urine sample Pt stated "No I do not need to pee, I haven't for a few days". During further assessment of his statement pt stated "I usually do not go until I am about to explode". Pt expressed feeling unlike himself since his loss stating "I am not crazy and I am not going to kill myself or anyone else".   Pt at first denied any past inpatient treatment but then recanted saying he was admitted somewhere 1-2 years ago overnight and released the next day. Pt was unable to provide information around where he was admitted. Pt reported feeling no need to follow up with counseling resources offered stating "I do not need to talk to a counselor, I just need to pray to deal with this". Pt confirmed to be living with family and having transportation to return home.   Per Cristofono, MD patient will be discharged and recommended to follow up with counseling resources provided.   Diagnosis: Brief unspecified psychosis  Past Medical History:  Past Medical History:  Diagnosis Date  . Hypertension   . Pre-diabetes     History reviewed. No pertinent surgical history.  Family History: History reviewed.  No pertinent family history.  Social History:  reports that he has been smoking cigars. He has never used smokeless tobacco. He reports current alcohol use. He reports that he does not use drugs.  Additional Social History:  Alcohol / Drug Use Pain Medications: see mar Prescriptions: see mar Over the Counter: see mar  CIWA: CIWA-Ar BP: (!) 159/100 Pulse Rate: 87 COWS:    Allergies:  Allergies  Allergen Reactions  . Sulfa Antibiotics Hives    Home Medications: (Not in a hospital admission)   OB/GYN Status:  No LMP for male patient.  General Assessment Data Location of Assessment: [facility] ED TTS Assessment: In system Is this a Tele or Face-to-Face Assessment?: Face-to-Face Is this an Initial Assessment or a Re-assessment for this encounter?: Initial Assessment Patient Accompanied by:: N/A Language Other than English: No Living Arrangements: Other (Comment)(family ) What gender do you identify as?: Male Marital status: Single Pregnancy Status: No Living Arrangements: Other relatives Can pt return to current living arrangement?: Yes Admission Status: Involuntary([employer]PD) Petitioner: Police Is patient capable of signing voluntary admission?: Yes Referral Source: Other Insurance type: None   Medical Screening Exam [facility] Walk-in ONLY) Medical Exam completed: Yes  Crisis Care Plan Living Arrangements: Other relatives Name of Psychiatrist: None reported  Name of Therapist: None reported   Education Status Is patient currently in school?: No Is the patient employed, unemployed or receiving disability?: Unemployed(Unknown )  Risk to self with the past 6 months Suicidal Ideation: No Has patient been a risk  to self within the past 6 months prior to admission? : No(CURRENT INCIDENT ) Suicidal Intent: No-Not Currently/Within Last 6 Months Has patient had any suicidal intent within the past 6 months prior to admission? : No(Current incident ) Suicidal Plan?: No-Not  Currently/Within Last 6 Months Has patient had any suicidal plan within the past 6 months prior to admission? : No Access to Means: No What has been your use of drugs/alcohol within the last 12 months?: Unknown (refuse to give urine during assessment ) Previous Attempts/Gestures: No Triggers for Past Attempts: None known Intentional Self Injurious Behavior: None Family Suicide History: Unknown Recent stressful life event(s): Trauma (Comment), Loss (Comment)(viewing of cousin's dead body) Persecutory voices/beliefs?: No Depression: Yes Depression Symptoms: (coping with grief) Substance abuse history and/or treatment for substance abuse?: No(None reported ) Suicide prevention information given to non-admitted patients: Yes  Risk to Others within the past 6 months Homicidal Ideation: No Does patient have any lifetime risk of violence toward others beyond the six months prior to admission? : Unknown Thoughts of Harm to Others: No Current Homicidal Intent: No Current Homicidal Plan: No Access to Homicidal Means: No History of harm to others?: No Assessment of Violence: None Noted Does patient have access to weapons?: No Criminal Charges Pending?: No(None reported ) Does patient have a court date: No(None reported ) Is patient on probation?: Unknown  Psychosis Hallucinations: None noted Delusions: None noted  Mental Status Report Appearance/Hygiene: In scrubs Eye Contact: Good Motor Activity: Unremarkable Speech: Logical/coherent Level of Consciousness: Alert Mood: Depressed, Anxious, Sad Affect: Anxious, Appropriate to circumstance, Depressed, Sad Anxiety Level: Moderate Thought Processes: Coherent, Relevant, Circumstantial Judgement: Partial Orientation: Person, Place, Appropriate for developmental age, Situation, Time Obsessive Compulsive Thoughts/Behaviors: None  Cognitive Functioning Concentration: Good Memory: Recent Intact, Remote Intact Is patient IDD: No Insight:  Good Impulse Control: Good Appetite: Good Have you had any weight changes? : No Change(None reported ) Sleep: No Change(None reported ) Vegetative Symptoms: None  ADLScreening [facility] Services) Patient able to express need for assistance with ADLs?: Yes  Prior Inpatient Therapy Prior Inpatient Therapy: Yes(1-2 years ago) Prior Therapy Dates: 1-2 years ago  Prior Therapy Facilty/Provider(s): unknown   Prior Outpatient Therapy Prior Outpatient Therapy: No Does patient have an ACCT team?: No Does patient have Intensive In-House Services?  : No Does patient have [facility] services? : No Does patient have P4CC services?: No  ADL Screening (condition at time of admission) Is the patient deaf or have difficulty hearing?: No Does the patient have difficulty seeing, even when wearing glasses/contacts?: No Does the patient have difficulty concentrating, remembering, or making decisions?: No Patient able to express need for assistance with ADLs?: Yes Does the patient have difficulty dressing or bathing?: No Does the patient have difficulty walking or climbing stairs?: No Weakness of Legs: None Weakness of Arms/Hands: None       Abuse/Neglect Assessment (Assessment to be complete while patient is alone) Abuse/Neglect Assessment Can Be Completed: Unable to assess, patient is non-responsive or altered mental status(None reported) Values / Beliefs Cultural Requests During Hospitalization: None Spiritual Requests During Hospitalization: None Consults Spiritual Care Consult Needed: No Transition of Care Team Consult Needed: No Advance Directives (For Healthcare) Does Patient Have a Medical Advance Directive?: No Would patient like information on creating a medical advance directive?: No - Patient declined          Disposition:  Disposition Initial Assessment Completed for this Encounter: Yes  On Site Evaluation by:   Reviewed with Physician:  Opal Sidles,  LCSWA 10/11/2019 2:14 PM

## 2019-10-11 NOTE — ED Notes (Signed)
Pt safe key from security was placed in pyxis

## 2019-10-11 NOTE — ED Notes (Signed)
Patient talking to TTS and psychiatry  

## 2019-10-11 NOTE — ED Notes (Signed)
Pt is lying in bed, alert and oriented to self, place, and time. Pt still unsure about situation that occurred and why he is in the hospital, this RN explained to pt situation. Respirations are equal and unlabored, no signs of acute distress.

## 2019-10-11 NOTE — ED Notes (Signed)
Patient has spoken with psychiatry and TTS

## 2019-10-11 NOTE — Discharge Instructions (Addendum)
Return to the ER for any new, worsening, or recurrent symptoms such as paranoia, invasive thoughts, or any thoughts of wanting to hurt yourself or others.

## 2019-10-11 NOTE — Consult Note (Signed)
[facility] Face-to-Face Psychiatry Consult   Reason for Consult: Paranoia and bizarre behavior Referring Physician: Siadecki Patient Identification: Brent Matthews MRN:  [id number] Principal Diagnosis: <principal problem not specified> Diagnosis:  Active Problems:   * No active hospital problems. *   Total Time spent with patient: 45 minutes  Subjective:   Brent Matthews is a 49 y.o. male patient who presented with paranoid thoughts and feelings of being poisoned.Marland Kitchen  HPI: Patient is a 49 year old male presented with paranoid thoughts and feelings of being poisoned.  Patient was initially very erratic all and difficult to redirect.  Required IM medication and slept overnight.  Upon awakening this morning patient is calm and cooperative denying any psychiatric symptoms.  He denies any SI or HI.  He does acknowledge some stressful time recently as having lost his cousin about a week ago in a car accident.  Patient states that he was hanging out with some different people and likely took some drugs yesterday which he is unsure of and unwilling to admit to at this time.  Patient does however deny any psychosis or current delusions.  He is denying any suicidal homicidal ideation.  Denies any manic symptoms or previous mental health diagnoses  Past Psychiatric History: Patient states that he has 1 prior hospitalization for similar circumstances.  Chart review shows that patient was intoxicated with cocaine at that time.  Risk to Self:  No Risk to Others:  No Prior Inpatient Therapy:  Yes Prior Outpatient Therapy:  Yes  Past Medical History:  Past Medical History:  Diagnosis Date  . Hypertension   . Pre-diabetes    History reviewed. No pertinent surgical history. Family History: History reviewed. No pertinent family history. Family Psychiatric  History:  Social History:  Social History   Substance and Sexual Activity  Alcohol Use Yes   Comment: One 40 oz 3x weekly     Social History    Substance and Sexual Activity  Drug Use No    Social History   Socioeconomic History  . Marital status: Single    Spouse name: Not on file  . Number of children: Not on file  . Years of education: Not on file  . Highest education level: Not on file  Occupational History  . Not on file  Tobacco Use  . Smoking status: Current Every Day Smoker    Types: Cigars  . Smokeless tobacco: Never Used  Substance and Sexual Activity  . Alcohol use: Yes    Comment: One 40 oz 3x weekly  . Drug use: No  . Sexual activity: Not on file  Other Topics Concern  . Not on file  Social History Narrative  . Not on file   Social Determinants of Health   Financial Resource Strain:   . Difficulty of Paying Living Expenses:   Food Insecurity:   . Worried About Programme researcher, broadcasting/film/video in the Last Year:   . Barista in the Last Year:   Transportation Needs:   . Freight forwarder (Medical):   Marland Kitchen Lack of Transportation (Non-Medical):   Physical Activity:   . Days of Exercise per Week:   . Minutes of Exercise per Session:   Stress:   . Feeling of Stress :   Social Connections:   . Frequency of Communication with Friends and Family:   . Frequency of Social Gatherings with Friends and Family:   . Attends Religious Services:   . Active Member of Clubs or Organizations:   . Attends  Club or Organization Meetings:   Marland Kitchen Marital Status:    Additional Social History:    Allergies:   Allergies  Allergen Reactions  . Sulfa Antibiotics Hives    Labs:  Results for orders placed or performed during the hospital encounter of 10/10/19 (from the past 48 hour(s))  Comprehensive metabolic panel     Status: Abnormal   Collection Time: 10/10/19  9:59 PM  Result Value Ref Range   Sodium 138 135 - 145 mmol/L   Potassium 3.7 3.5 - 5.1 mmol/L   Chloride 106 98 - 111 mmol/L   CO2 24 22 - 32 mmol/L   Glucose, Bld 93 70 - 99 mg/dL    Comment: Glucose reference range applies only to samples taken  after fasting for at least 8 hours.   BUN 14 6 - 20 mg/dL   Creatinine, Ser 2.83 (H) 0.61 - 1.24 mg/dL   Calcium 9.4 8.9 - 15.1 mg/dL   Total Protein 6.8 6.5 - 8.1 g/dL   Albumin 4.0 3.5 - 5.0 g/dL   AST 29 15 - 41 U/L   ALT 21 0 - 44 U/L   Alkaline Phosphatase 57 38 - 126 U/L   Total Bilirubin 0.7 0.3 - 1.2 mg/dL   GFR calc non Af Amer >60 >60 mL/min   GFR calc Af Amer >60 >60 mL/min   Anion gap 8 5 - 15    Comment: Performed at [facility], [address] [place], [place] [address]  Ethanol     Status: None   Collection Time: 10/10/19  9:59 PM  Result Value Ref Range   Alcohol, Ethyl (B) <10 <10 mg/dL    Comment: (NOTE) Lowest detectable limit for serum alcohol is 10 mg/dL. For medical purposes only. Performed at [facility], [address] [place], [place] [address]   CBC with Diff     Status: None   Collection Time: 10/10/19  9:59 PM  Result Value Ref Range   WBC 9.5 4.0 - 10.5 K/uL   RBC 4.77 4.22 - 5.81 MIL/uL   Hemoglobin 14.4 13.0 - 17.0 g/dL   HCT 62.6 94.8 - 54.6 %   MCV 93.5 80.0 - 100.0 fL   MCH 30.2 26.0 - 34.0 pg   MCHC 32.3 30.0 - 36.0 g/dL   RDW 27.0 35.0 - 09.3 %   Platelets 246 150 - 400 K/uL   nRBC 0.0 0.0 - 0.2 %   Neutrophils Relative % 80 %   Neutro Abs 7.6 1.7 - 7.7 K/uL   Lymphocytes Relative 14 %   Lymphs Abs 1.4 0.7 - 4.0 K/uL   Monocytes Relative 5 %   Monocytes Absolute 0.4 0.1 - 1.0 K/uL   Eosinophils Relative 0 %   Eosinophils Absolute 0.0 0.0 - 0.5 K/uL   Basophils Relative 0 %   Basophils Absolute 0.0 0.0 - 0.1 K/uL   Immature Granulocytes 1 %   Abs Immature Granulocytes 0.05 0.00 - 0.07 K/uL    Comment: Performed at [facility], [address] [place], [place] [address]  Salicylate level     Status: Abnormal   Collection Time: 10/10/19  9:59 PM  Result Value Ref Range   Salicylate Lvl <7.0 (L) 7.0 - 30.0 mg/dL    Comment: Performed at [facility], [address], [place], [place]  [address]  Acetaminophen level     Status: Abnormal   Collection Time: 10/10/19  9:59 PM  Result Value Ref Range  Acetaminophen (Tylenol), Serum <10 (L) 10 - 30 ug/mL    Comment: (NOTE) Therapeutic concentrations vary significantly. A range of 10-30 ug/mL  may be an effective concentration for many patients. However, some  are best treated at concentrations outside of this range. Acetaminophen concentrations >150 ug/mL at 4 hours after ingestion  and >50 ug/mL at 12 hours after ingestion are often associated with  toxic reactions. Performed at [facility], [address] [place], [place] [address]   CK     Status: Abnormal   Collection Time: 10/10/19  9:59 PM  Result Value Ref Range   Total CK 426 (H) 49 - 397 U/L    Comment: Performed at [facility], [address] [place], [place] [address]  Troponin I (High Sensitivity)     Status: Abnormal   Collection Time: 10/10/19  9:59 PM  Result Value Ref Range   Troponin I (High Sensitivity) 35 (H) <18 ng/L    Comment: (NOTE) Elevated high sensitivity troponin I (hsTnI) values and significant  changes across serial measurements may suggest ACS but many other  chronic and acute conditions are known to elevate hsTnI results.  Refer to the "Links" section for chest pain algorithms and additional  guidance. Performed at [facility], [address] [place], [place] [address]   Magnesium     Status: None   Collection Time: 10/10/19  9:59 PM  Result Value Ref Range   Magnesium 2.1 1.7 - 2.4 mg/dL    Comment: Performed at [facility], [address] [place], [place] [address]  Troponin I (High Sensitivity)     Status: Abnormal   Collection Time: 10/11/19 12:30 AM  Result Value Ref Range   Troponin I (High Sensitivity) 40 (H) <18 ng/L    Comment: (NOTE) Elevated high sensitivity troponin I (hsTnI) values and significant  changes across serial measurements may suggest ACS but many other   chronic and acute conditions are known to elevate hsTnI results.  Refer to the "Links" section for chest pain algorithms and additional  guidance. Performed at [facility], [address] [place], [place] [address]   Respiratory Panel by RT PCR (Flu A&B, Covid) - Nasopharyngeal Swab     Status: None   Collection Time: 10/11/19  6:29 AM   Specimen: Nasopharyngeal Swab  Result Value Ref Range   SARS Coronavirus 2 by RT PCR NEGATIVE NEGATIVE    Comment: (NOTE) SARS-CoV-2 target nucleic acids are NOT DETECTED. The SARS-CoV-2 RNA is generally detectable in upper respiratoy specimens during the acute phase of infection. The lowest concentration of SARS-CoV-2 viral copies this assay can detect is 131 copies/mL. A negative result does not preclude SARS-Cov-2 infection and should not be used as the sole basis for treatment or other patient management decisions. A negative result may occur with  improper specimen collection/handling, submission of specimen other than nasopharyngeal swab, presence of viral mutation(s) within the areas targeted by this assay, and inadequate number of viral copies (<131 copies/mL). A negative result must be combined with clinical observations, patient history, and epidemiological information. The expected result is Negative. Fact Sheet for Patients:  https://www.moore.com/ Fact Sheet for Healthcare Providers:  https://www.young.biz/ This test is not yet ap proved or cleared by the [place] FDA and  has been authorized for detection and/or diagnosis of SARS-CoV-2 by FDA under an Emergency Use Authorization (EUA). This EUA will remain  in effect (meaning this test can be used) for the duration of the COVID-19  declaration under Section 564(b)(1) of the Act, 21 U.S.C. section 360bbb-3(b)(1), unless the authorization is terminated or revoked sooner.    Influenza A by PCR NEGATIVE NEGATIVE   Influenza B by  PCR NEGATIVE NEGATIVE    Comment: (NOTE) The Xpert Xpress SARS-CoV-2/FLU/RSV assay is intended as an aid in  the diagnosis of influenza from Nasopharyngeal swab specimens and  should not be used as a sole basis for treatment. Nasal washings and  aspirates are unacceptable for Xpert Xpress SARS-CoV-2/FLU/RSV  testing. Fact Sheet for Patients: PinkCheek.be Fact Sheet for Healthcare Providers: GravelBags.it This test is not yet approved or cleared by the [place] FDA and  has been authorized for detection and/or diagnosis of SARS-CoV-2 by  FDA under an Emergency Use Authorization (EUA). This EUA will remain  in effect (meaning this test can be used) for the duration of the  Covid-19 declaration under Section 564(b)(1) of the Act, 21  U.S.C. section 360bbb-3(b)(1), unless the authorization is  terminated or revoked. Performed at [facility], [address] [place], [place] [address]     No current facility-administered medications for this encounter.   Current Outpatient Medications  Medication Sig Dispense Refill  . lisinopril-hydrochlorothiazide (PRINZIDE,ZESTORETIC) 20-25 MG tablet Take 1 tablet by mouth daily.      Musculoskeletal: Strength & Muscle Tone: within normal limits Gait & Station: normal Patient leans: N/A  Psychiatric Specialty Exam: Physical Exam  Review of Systems  Blood pressure (!) 159/100, pulse 87, resp. rate 12, height 5\' 8"  (1.727 m), weight 113.4 kg, SpO2 100 %.Body mass index is 38.01 kg/m.  General Appearance: Casual  Eye Contact:  Fair  Speech:  Clear and Coherent  Volume:  Normal  Mood:  Euthymic  Affect:  Congruent  Thought Process:  Coherent  Orientation:  Full (Time, Place, and Person)  Thought Content:  Logical  Suicidal Thoughts:  No  Homicidal Thoughts:  No  Memory:  Recent;   Fair  Judgement:  Intact  Insight:  Fair  Psychomotor Activity:  Normal   Concentration:  Concentration: Fair  Recall:  [employer] of Knowledge:  Fair  Language:  Fair  Akathisia:  Yes  Handed:  Right  AIMS (if indicated):     Assets:  Communication Skills Desire for Improvement Housing Resilience Social Support  ADL's:  Intact  Cognition:  WNL  Sleep:        Treatment Plan Summary: Patient is a 49 year old male with no prior psychiatric hospitalizations who is presenting in an acute state of paranoia and agitation.  Upon given time to rest in the ED patient woke up this morning calm and cooperative denying any psychiatric symptoms with no symptoms evident.  Patient at this time is not thought to be a risk to himself or others.  Patient's presentation is likely explained by drug use.  Patient does not meet criteria for inpatient hospitalization and will be discharged to community.  Diagnosis: Polysubstance abuse  Disposition: No evidence of imminent risk to self or others at present.   Patient does not meet criteria for psychiatric inpatient admission. Supportive therapy provided about ongoing stressors. Discussed crisis plan, support from social network, calling 911, coming to the Emergency Department, and calling Suicide Hotline.  Dixie Dials, MD 10/11/2019 11:53 AM

## 2019-10-11 NOTE — ED Notes (Signed)
MD Manson Passey made aware of pt BP of 164/102.

## 2019-10-11 NOTE — ED Notes (Signed)
This RN attempted to collect urine from pt, pt states that he does not have to urinate at this time. This RN placed urinal at bedside for pt to use, MD Manson Passey made aware.

## 2019-10-11 NOTE — ED Notes (Signed)
Dr. Manson Passey made aware of pt BP of 172/103.

## 2019-10-11 NOTE — ED Provider Notes (Signed)
-----------------------------------------   2:28 PM on 10/11/2019 -----------------------------------------  Dr. Cindi Carbon from psychiatry has evaluated the patient and cleared him for discharge.  The IVC is rescinded.  The patient is stable for discharge at this time.  Return precautions provided.   Dionne Bucy, MD 10/11/19 1429

## 2019-10-11 NOTE — ED Notes (Signed)
This RN spoke with pt daughter, Hatcher Froning, with permission from pt, and gave her an update on pt status and plan of care.

## 2019-10-11 NOTE — ED Notes (Signed)
Patient assigned to appropriate care area   Introduced self to pt  Patient oriented to unit/care area: Informed that, for their safety, care areas are designed for safety and visiting and phone hours explained to patient. Patient verbalizes understanding, and verbal contract for safety obtained  Environment secured  

## 2019-10-11 NOTE — ED Provider Notes (Signed)
The patient has been placed in psychiatric observation due to the need to provide a safe environment for the patient while obtaining psychiatric consultation and evaluation, as well as ongoing medical and medication management to treat the patient's condition.  The patient hasbeen placed under full IVC at this time.    Darci Current, MD 10/11/19 [phone]

## 2019-10-13 MED ORDER — LIDOCAINE HCL 1 % IJ SOLN
0.50 | INTRAMUSCULAR | Status: DC
Start: ? — End: 2019-10-13

## 2021-03-01 ENCOUNTER — Emergency Department
Admission: EM | Admit: 2021-03-01 | Discharge: 2021-03-01 | Disposition: A | Discharge status: Home / Self Care | Payer: Medicaid Other | Attending: Emergency Medicine | Admitting: Emergency Medicine

## 2021-03-01 DIAGNOSIS — Z5321 Procedure and treatment not carried out due to patient leaving prior to being seen by health care provider: Secondary | ICD-10-CM | POA: Insufficient documentation

## 2021-03-01 DIAGNOSIS — R079 Chest pain, unspecified: Secondary | ICD-10-CM | POA: Insufficient documentation

## 2021-03-01 NOTE — ED Triage Notes (Addendum)
Pt states left sided chest pain that began today at unknown time. Pt denies shob, dizziness, pt refusing blood work in triage and will not get off phone. Pt repeatedly getting up and taking off blood pressure cuff and taking pills out of a bottle and will not tell staff what he is taking, will not allow an ekg or get blood pressure at this time. Pt states he needs to go and get his girlfriend and needs to leave. Pt won't answer any triage questions, states he is leaving and will not sign out ama and will not participate in triage at this time. pt encouraged to stay and have ekg and be seen by MD due to elevated heart rate. Pt ambulatory from triage before risks of leaving could be discussed with pt. Pt states he is taking his blood pressure medication in triage from bottles. Pt refuses to stay in building at this time for medical care.

## 2021-03-01 NOTE — ED Notes (Signed)
Pt outside sitting in driver's seat of car, attempted to approach pt to encourage pt to return to ed that I am medically concerned about his hr and chest pain, pt turned lights in car on, gave peace sign and pulled off.

## 2021-08-19 DIAGNOSIS — I10 Essential (primary) hypertension: Secondary | ICD-10-CM | POA: Diagnosis not present

## 2021-11-17 DIAGNOSIS — Z1331 Encounter for screening for depression: Secondary | ICD-10-CM | POA: Diagnosis not present

## 2021-11-17 DIAGNOSIS — I10 Essential (primary) hypertension: Secondary | ICD-10-CM | POA: Diagnosis not present

## 2021-11-17 DIAGNOSIS — M25511 Pain in right shoulder: Secondary | ICD-10-CM | POA: Diagnosis not present

## 2021-11-17 DIAGNOSIS — Z1211 Encounter for screening for malignant neoplasm of colon: Secondary | ICD-10-CM | POA: Diagnosis not present

## 2021-11-17 DIAGNOSIS — M79671 Pain in right foot: Secondary | ICD-10-CM | POA: Diagnosis not present

## 2021-11-17 DIAGNOSIS — G8929 Other chronic pain: Secondary | ICD-10-CM | POA: Diagnosis not present
